# Patient Record
Sex: Female | Born: 1970
Health system: Southern US, Community
[De-identification: ages and names within clinical notes are randomized; demographics above are authoritative.]

## PROBLEM LIST (undated history)

## (undated) HISTORY — PX: BREAST BIOPSY: SHX20

---

## 2009-09-27 ENCOUNTER — Other Ambulatory Visit: Admission: RE | Admit: 2009-09-27 | Discharge: 2009-09-27 | Payer: Self-pay | Admitting: Family Medicine

## 2009-09-29 ENCOUNTER — Encounter: Admission: RE | Admit: 2009-09-29 | Discharge: 2009-09-29 | Payer: Self-pay | Admitting: Family Medicine

## 2009-10-04 ENCOUNTER — Encounter: Admission: RE | Admit: 2009-10-04 | Discharge: 2009-10-04 | Payer: Self-pay | Admitting: Family Medicine

## 2009-10-17 ENCOUNTER — Encounter: Admission: RE | Admit: 2009-10-17 | Discharge: 2009-10-17 | Payer: Self-pay | Admitting: Family Medicine

## 2010-07-14 ENCOUNTER — Encounter
Admission: RE | Admit: 2010-07-14 | Discharge: 2010-07-14 | Payer: Self-pay | Source: Home / Self Care | Attending: Family Medicine | Admitting: Family Medicine

## 2010-07-14 IMAGING — MG MM DIGITAL DIAGNOSTIC BILAT
8 of 10 series · 8 of 10 positions shown · non-contrast
Comparison: [DATE], [DATE] she

CLINICAL DATA: The patient underwent stereotactic biopsy in [DATE] for calcifications in the right 10 o'clock position.  Biopsy
showed atypia and surgical excision has been recommended although
the patient has not yet undergone surgical excision.  Today the
patient returns for annual mammogram, with referring physician
palpating an area of thickening 3 o'clock left breast, and with the
patient feeling cyclical monthly tenderness and fullness in both
axillary areas.

DIGITAL DIAGNOSTIC BILATERAL MAMMOGRAM WITH CAD AND BILATERAL
BREAST ULTRASOUND:

[R CC]
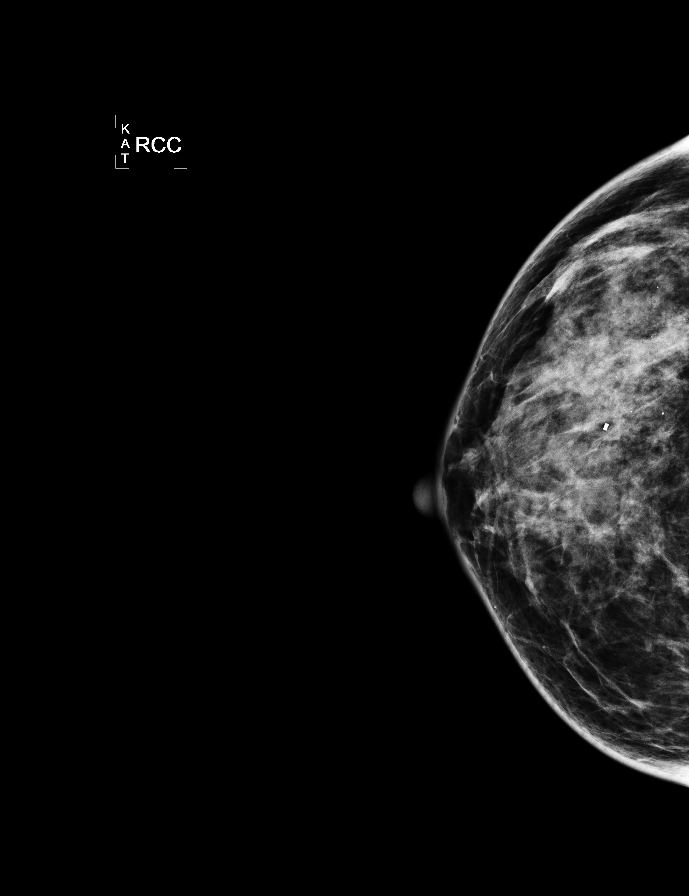

[L CC]
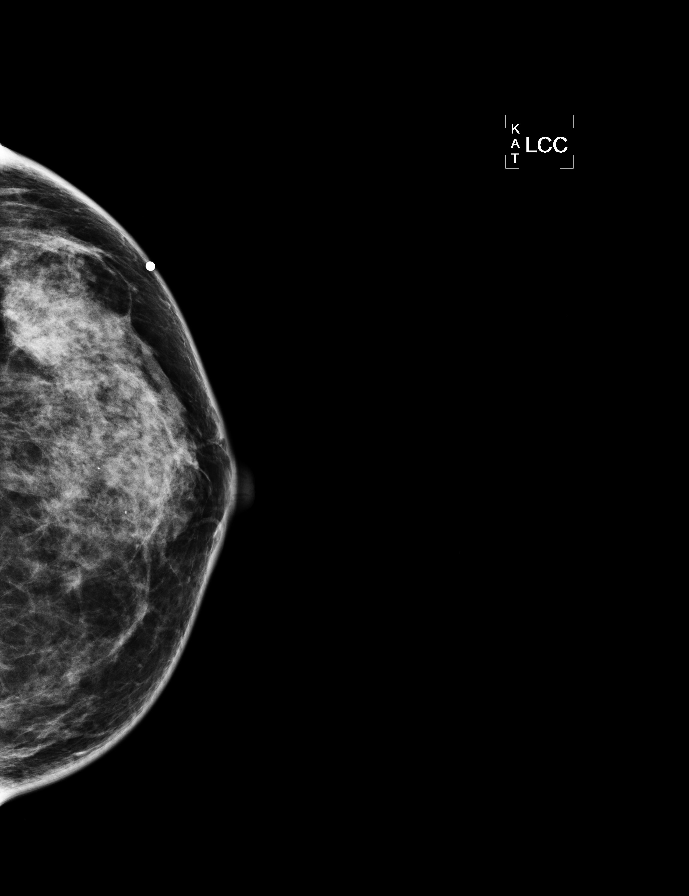

[L MLO]
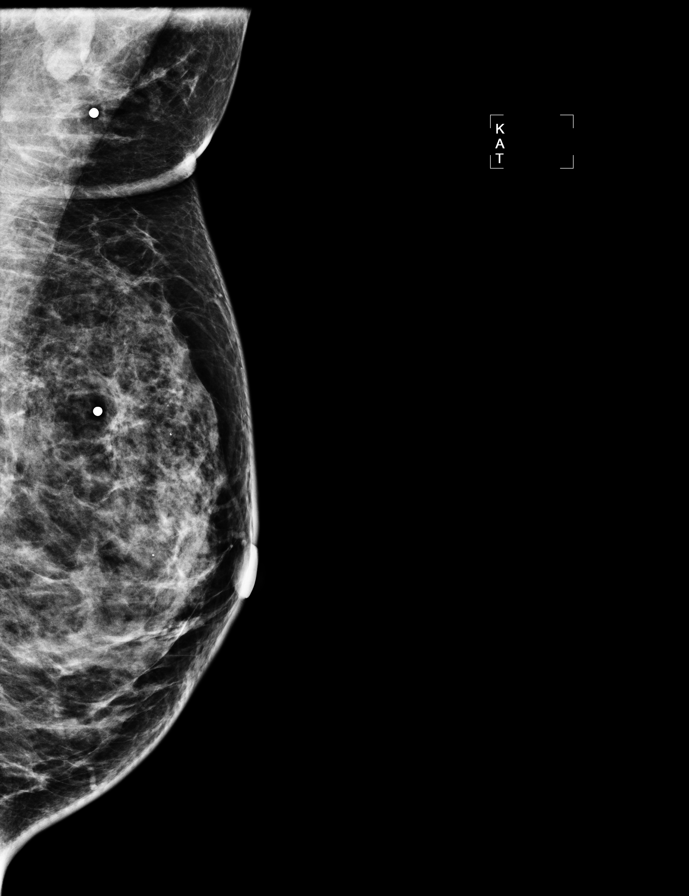

[R MLO (1 of 2)]
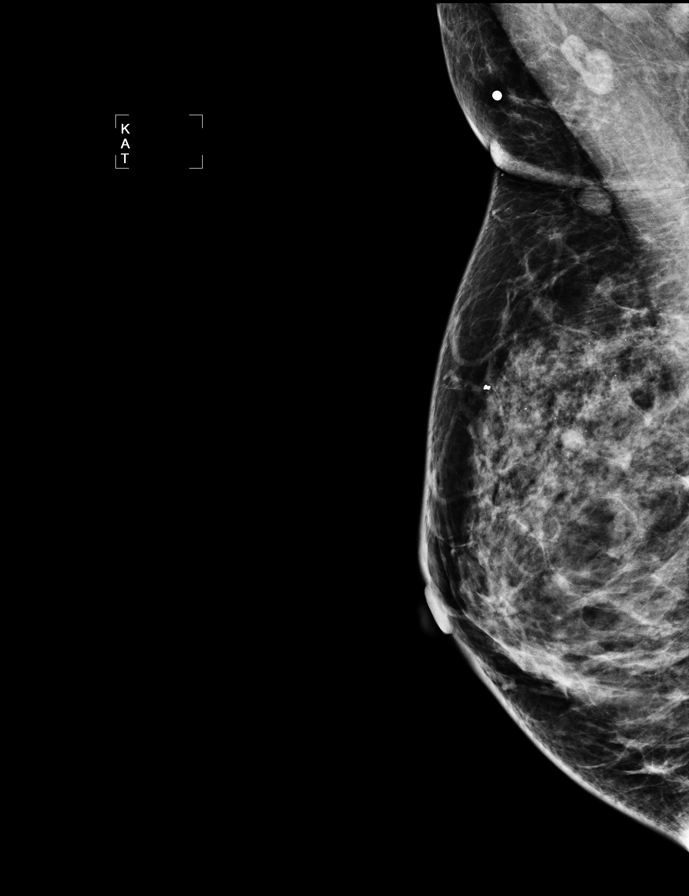

[L TAN (1 of 2)]
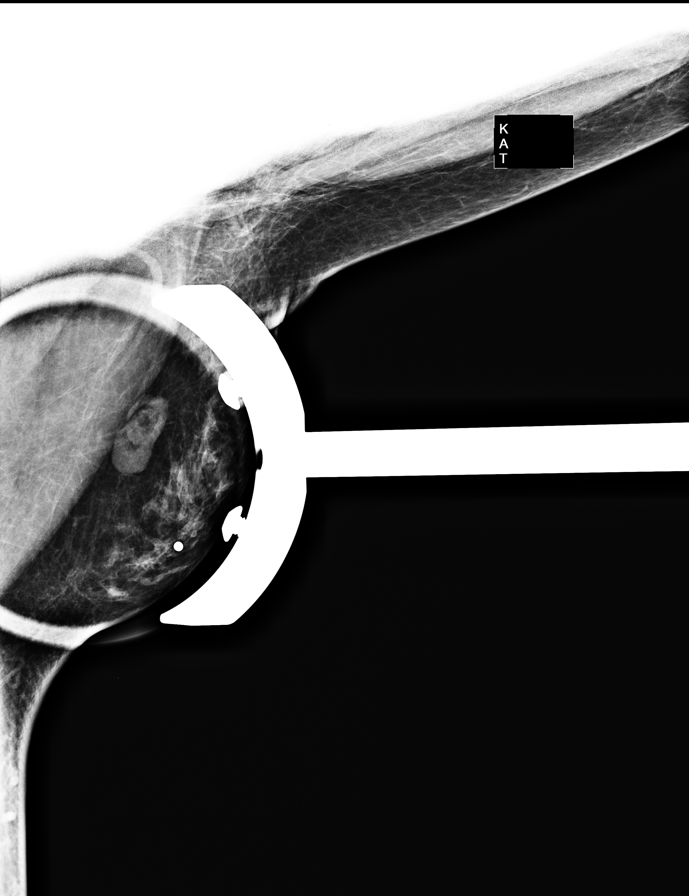

[R TAN]
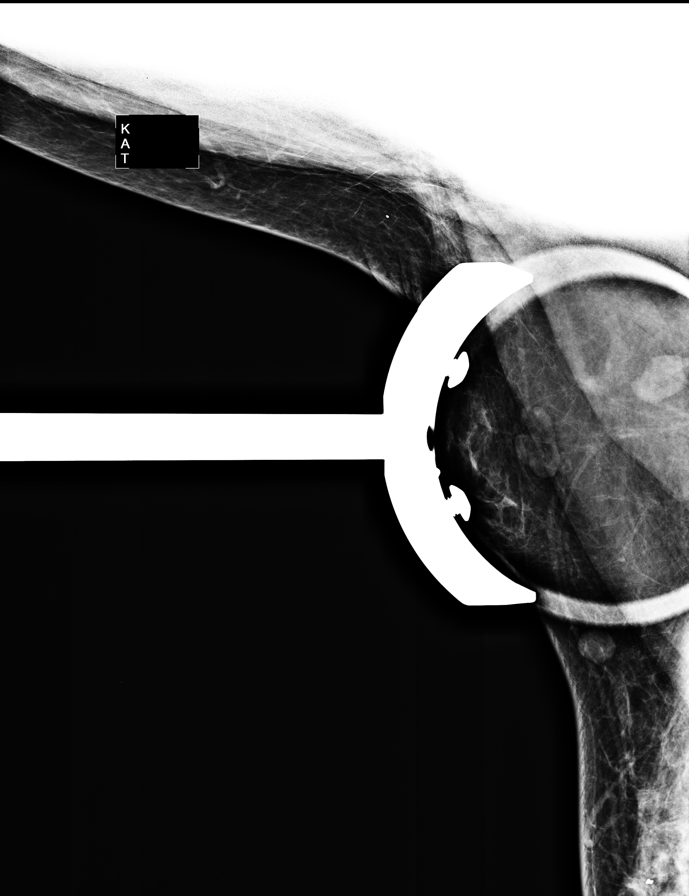

[L TAN (2 of 2)]
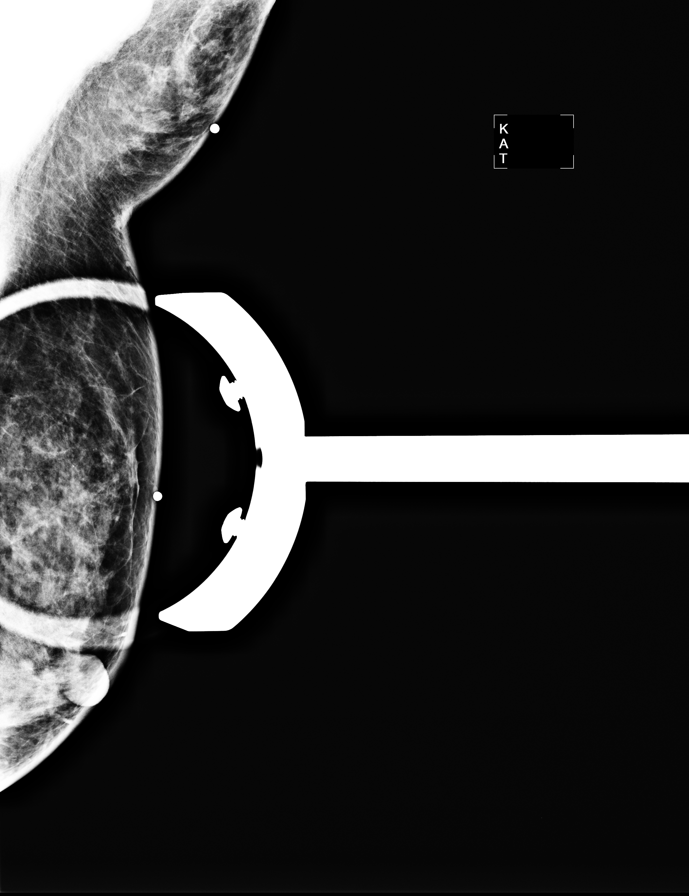

[R MLO (2 of 2)]
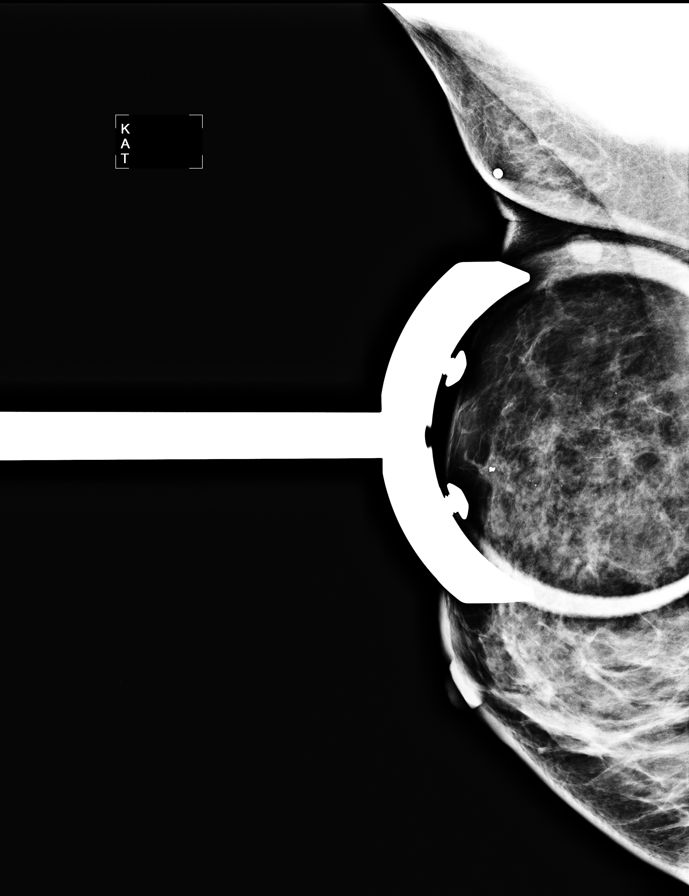

[8 of 10 positions shown; findings below may reference images not displayed]

FINDINGS: Breast tissue is heterogeneously dense.

On the right in the 12 o'clock position there is a stereotactic
marker clip replacing the previously biopsied calcifications.
Posterior and slightly inferior to this on the MLO view there is a
suggestion of a round approximately 5 mm asymmetry.  This does not
definitively persist on spot compression or true lateral views.
There are numerous sub centimeter right axillary lymph nodes.

On the left there are no suspicious mammographic abnormalities
including spot compression views of the area of palpable concern.
There are several normal-appearing left axillary lymph nodes.
Mammographic images were processed with CAD.

On physical exam, there is no suspicious palpable abnormality on
either side.

Ultrasound is performed, showing no abnormalities in the left
axilla or the left 3 o'clock position.  On the right, and in the
axillary tail the 10 o'clock position, there is an oval
circumscribed hypoechoic mass measuring 10 x 6 x 9 mm.  The central
portion has an area that is mildly hyperechoic and a blood vessel
is identified extending into the mass.  This may represent an
atypical appearing lymph node.  Ultrasound also demonstrates a
normal-appearing 1 cm axillary lymph node on the right.
IMPRESSION: No suspicious findings on the left.  On the right, in the 10
o'clock position approximately 10 cm from the nipple there is a new
suspicious mass that may represent an abnormal appearing lymph.

BI-RADS CATEGORY 4:  Suspicious abnormality - biopsy should be
considered.

Recommendation:

The patient will remain to undergo ultrasound guided core needle
biopsy today.  Regardless of the outcome of this procedure, the
patient has been strongly encouraged to undergo excisional biopsy
for the area of atypia discovered [DATE].  The patient has
agreed to have surgical consultation established on her behalf.  We
will await results of today's biopsy, and will inform the patient
of her established time for surgical consultation when we discuss
results with her on [REDACTED].

## 2012-12-04 ENCOUNTER — Other Ambulatory Visit: Payer: Self-pay | Admitting: Family Medicine

## 2012-12-04 DIAGNOSIS — R921 Mammographic calcification found on diagnostic imaging of breast: Secondary | ICD-10-CM

## 2012-12-22 ENCOUNTER — Other Ambulatory Visit: Payer: Self-pay

## 2012-12-30 ENCOUNTER — Ambulatory Visit
Admission: RE | Admit: 2012-12-30 | Discharge: 2012-12-30 | Disposition: A | Discharge status: Home / Self Care | Payer: Managed Care, Other (non HMO) | Source: Ambulatory Visit | Attending: Family Medicine | Admitting: Family Medicine

## 2012-12-30 ENCOUNTER — Other Ambulatory Visit: Payer: Self-pay | Admitting: Family Medicine

## 2012-12-30 DIAGNOSIS — R921 Mammographic calcification found on diagnostic imaging of breast: Secondary | ICD-10-CM

## 2013-05-27 ENCOUNTER — Other Ambulatory Visit: Payer: Self-pay | Admitting: Family Medicine

## 2013-05-27 DIAGNOSIS — R921 Mammographic calcification found on diagnostic imaging of breast: Secondary | ICD-10-CM

## 2013-07-06 ENCOUNTER — Ambulatory Visit
Admission: RE | Admit: 2013-07-06 | Discharge: 2013-07-06 | Disposition: A | Discharge status: Home / Self Care | Payer: Private Health Insurance - Indemnity | Source: Ambulatory Visit | Attending: Family Medicine | Admitting: Family Medicine

## 2013-07-06 DIAGNOSIS — R921 Mammographic calcification found on diagnostic imaging of breast: Secondary | ICD-10-CM

## 2013-08-10 ENCOUNTER — Other Ambulatory Visit: Payer: Self-pay | Admitting: Family Medicine

## 2013-08-10 ENCOUNTER — Other Ambulatory Visit (HOSPITAL_COMMUNITY)
Admission: RE | Admit: 2013-08-10 | Discharge: 2013-08-10 | Disposition: A | Discharge status: Home / Self Care | Payer: Private Health Insurance - Indemnity | Source: Ambulatory Visit | Attending: Family Medicine | Admitting: Family Medicine

## 2013-08-10 DIAGNOSIS — Z124 Encounter for screening for malignant neoplasm of cervix: Secondary | ICD-10-CM | POA: Insufficient documentation

## 2017-01-22 ENCOUNTER — Other Ambulatory Visit: Payer: Self-pay | Admitting: Family Medicine

## 2017-01-22 ENCOUNTER — Other Ambulatory Visit (HOSPITAL_COMMUNITY)
Admission: RE | Admit: 2017-01-22 | Discharge: 2017-01-22 | Disposition: A | Discharge status: Home / Self Care | Payer: Commercial Managed Care - PPO | Source: Ambulatory Visit | Attending: Family Medicine | Admitting: Family Medicine

## 2017-01-22 DIAGNOSIS — Z124 Encounter for screening for malignant neoplasm of cervix: Secondary | ICD-10-CM | POA: Diagnosis not present

## 2017-01-24 LAB — CYTOLOGY - PAP: DIAGNOSIS: NEGATIVE

## 2017-02-26 ENCOUNTER — Ambulatory Visit (INDEPENDENT_AMBULATORY_CARE_PROVIDER_SITE_OTHER): Payer: Commercial Managed Care - PPO | Admitting: Sports Medicine

## 2017-02-26 ENCOUNTER — Encounter: Payer: Self-pay | Admitting: Sports Medicine

## 2017-02-26 ENCOUNTER — Ambulatory Visit (INDEPENDENT_AMBULATORY_CARE_PROVIDER_SITE_OTHER): Payer: Commercial Managed Care - PPO

## 2017-02-26 VITALS — Ht 66.0 in | Wt 135.0 lb

## 2017-02-26 DIAGNOSIS — M722 Plantar fascial fibromatosis: Secondary | ICD-10-CM

## 2017-02-26 DIAGNOSIS — M79672 Pain in left foot: Secondary | ICD-10-CM

## 2017-02-26 NOTE — Progress Notes (Signed)
   Subjective:    Patient ID: Amy Roy, female    DOB: 04-18-1971, 46 y.o.   MRN: [id number]  HPI  My left heel feels swollen and pain about 3 months ago.  Started hurting worse when [place] dog [place] the beach.      Review of Systems  All other systems reviewed and are negative.      Objective:   Physical Exam        Assessment & Plan:

## 2017-02-26 NOTE — Progress Notes (Signed)
Subjective: Rudi HeapHanxi Ferrone is a 46 y.o. female patient presents to office with complaint of heel pain on the left. Patient admits to post static dyskinesia for 3 months. Patient has treated this problem with stretching with no relief. Denies any other pedal complaints.   There are no active problems to display for this patient.   No current outpatient prescriptions on file prior to visit.   No current facility-administered medications on file prior to visit.     No Known Allergies  Objective: Physical Exam General: The patient is alert and oriented x3 in no acute distress.  Dermatology: Skin is warm, dry and supple bilateral lower extremities. Nails 1-10 are normal. There is no erythema, edema, no eccymosis, no open lesions present. Integument is otherwise unremarkable.  Vascular: Dorsalis Pedis pulse and Posterior Tibial pulse are 2/4 bilateral. Capillary fill time is immediate to all digits.  Neurological: Grossly intact to light touch with an achilles reflex of +2/5 and a  negative Tinel's sign bilateral.  Musculoskeletal: Tenderness to palpation at the medial calcaneal tubercale and through the insertion of the plantar fascia on the left foot. No pain with compression of calcaneus bilateral. No pain with tuning fork to calcaneus bilateral. No pain with calf compression bilateral. There is decreased Ankle joint range of motion bilateral. All other joints range of motion within normal limits bilateral. Strength 5/5 in all groups bilateral.   Gait: Unassisted, Antalgic avoid weight on left/right heel  Xray, Left foot:  Normal osseous mineralization. Joint spaces preserved. No fracture/dislocation/boney destruction. Calcaneal spur present with mild thickening of plantar fascia. No other soft tissue abnormalities or radiopaque foreign bodies.   Assessment and Plan: Problem List Items Addressed This Visit    None    Visit Diagnoses    Plantar fasciitis    -  Primary   Relevant Orders    DG Foot Complete Left (Completed)   Pain of left heel          -Complete examination performed.  -Xrays reviewed -Discussed with patient in detail the condition of plantar fasciitis, how this occurs and general treatment options. Explained both conservative and surgical treatments.  -Patient declined injection and oral meds this visit  -Recommend at minimum OTC motrin one tab BID x 14 days -Recommended good supportive shoes and advised use of OTC insert. Explained to patient that if these orthoses work well, we will continue with these. If these do not improve her condition and  pain, we will consider custom molded orthoses. -Explained in detail the use of the fascial brace for the left which was dispensed at today's visit. -Explained and dispensed to patient daily stretching exercises. -Recommend patient to ice affected area 1-2x daily. -Patient to return to office in 4 weeks for follow up or sooner if problems or questions arise. Advised patient if no better to consider injection next visit.   Asencion Islamitorya Chrishun Scheer, DPM

## 2017-02-26 NOTE — Patient Instructions (Signed)

## 2017-03-26 ENCOUNTER — Ambulatory Visit (INDEPENDENT_AMBULATORY_CARE_PROVIDER_SITE_OTHER): Payer: Commercial Managed Care - PPO | Admitting: Sports Medicine

## 2017-03-26 ENCOUNTER — Encounter: Payer: Self-pay | Admitting: Sports Medicine

## 2017-03-26 DIAGNOSIS — M722 Plantar fascial fibromatosis: Secondary | ICD-10-CM

## 2017-03-26 DIAGNOSIS — M79672 Pain in left foot: Secondary | ICD-10-CM

## 2017-03-26 NOTE — Progress Notes (Signed)
Subjective: Kellyn Kutsch is a 46 y.o. female patient returns for follow up on left heel pain. Reports her new shoes and stretching and icing has helped. Does not have any pain. Denies any other pedal complaints.   There are no active problems to display for this patient.   No current outpatient prescriptions on file prior to visit.   No current facility-administered medications on file prior to visit.     No Known Allergies  Objective: Physical Exam General: The patient is alert and oriented x3 in no acute distress.  Dermatology: Skin is warm, dry and supple bilateral lower extremities. Nails 1-10 are normal. There is no erythema, edema, no eccymosis, no open lesions present. Integument is otherwise unremarkable.  Vascular: Dorsalis Pedis pulse and Posterior Tibial pulse are 2/4 bilateral. Capillary fill time is immediate to all digits.  Neurological: Grossly intact to light touch with an achilles reflex of +2/5 and a  negative Tinel's sign bilateral.  Musculoskeletal: There is no tenderness to palpation at the medial calcaneal tubercale and through the insertion of the plantar fascia on the left foot. No pain with compression of calcaneus bilateral. No pain with tuning fork to calcaneus bilateral. No pain with calf compression bilateral. There is decreased Ankle joint range of motion bilateral. All other joints range of motion within normal limits bilateral. Strength 5/5 in all groups bilateral.   Assessment and Plan: Problem List Items Addressed This Visit    None    Visit Diagnoses    Plantar fasciitis    -  Primary   Pain of left heel         -Complete examination performed.  -Re-Discussed with patient in detail the condition of plantar fasciitis, how this occurs and long term care. -Continue with fascial brace x 1 month  -Continue with daily stretching exercises to prevent recurrence  -Recommend patient to ice affected area 1-2x daily after activity to prevent  flare. -Patient to return to office as needed for follow up or sooner if problems or questions arise.  Asencion Islam, DPM

## 2020-11-30 ENCOUNTER — Other Ambulatory Visit: Payer: Self-pay | Admitting: Family Medicine

## 2020-11-30 DIAGNOSIS — Z1231 Encounter for screening mammogram for malignant neoplasm of breast: Secondary | ICD-10-CM

## 2021-01-16 ENCOUNTER — Ambulatory Visit
Admission: RE | Admit: 2021-01-16 | Discharge: 2021-01-16 | Disposition: A | Discharge status: Home / Self Care | Payer: Commercial Managed Care - PPO | Source: Ambulatory Visit | Attending: Family Medicine | Admitting: Family Medicine

## 2021-01-16 ENCOUNTER — Other Ambulatory Visit: Payer: Self-pay

## 2021-01-16 DIAGNOSIS — Z1231 Encounter for screening mammogram for malignant neoplasm of breast: Secondary | ICD-10-CM

## 2021-01-19 ENCOUNTER — Other Ambulatory Visit: Payer: Self-pay | Admitting: Family Medicine

## 2021-01-19 DIAGNOSIS — R928 Other abnormal and inconclusive findings on diagnostic imaging of breast: Secondary | ICD-10-CM

## 2021-01-24 ENCOUNTER — Other Ambulatory Visit: Payer: Self-pay | Admitting: Family Medicine

## 2021-01-24 ENCOUNTER — Other Ambulatory Visit: Payer: Self-pay

## 2021-01-24 ENCOUNTER — Ambulatory Visit
Admission: RE | Admit: 2021-01-24 | Discharge: 2021-01-24 | Disposition: A | Discharge status: Home / Self Care | Payer: Commercial Managed Care - PPO | Source: Ambulatory Visit | Attending: Family Medicine | Admitting: Family Medicine

## 2021-01-24 DIAGNOSIS — R928 Other abnormal and inconclusive findings on diagnostic imaging of breast: Secondary | ICD-10-CM

## 2021-01-24 DIAGNOSIS — R921 Mammographic calcification found on diagnostic imaging of breast: Secondary | ICD-10-CM

## 2021-02-02 ENCOUNTER — Ambulatory Visit
Admission: RE | Admit: 2021-02-02 | Discharge: 2021-02-02 | Disposition: A | Discharge status: Home / Self Care | Payer: Commercial Managed Care - PPO | Source: Ambulatory Visit | Attending: Family Medicine | Admitting: Family Medicine

## 2021-02-02 ENCOUNTER — Other Ambulatory Visit: Payer: Self-pay

## 2021-02-02 ENCOUNTER — Other Ambulatory Visit: Payer: Self-pay | Admitting: Family Medicine

## 2021-02-02 DIAGNOSIS — R921 Mammographic calcification found on diagnostic imaging of breast: Secondary | ICD-10-CM

## 2022-01-29 ENCOUNTER — Other Ambulatory Visit: Payer: Self-pay | Admitting: Family Medicine

## 2022-01-29 DIAGNOSIS — Z1231 Encounter for screening mammogram for malignant neoplasm of breast: Secondary | ICD-10-CM

## 2022-02-13 ENCOUNTER — Ambulatory Visit
Admission: RE | Admit: 2022-02-13 | Discharge: 2022-02-13 | Disposition: A | Discharge status: Home / Self Care | Payer: Commercial Managed Care - PPO | Source: Ambulatory Visit | Attending: Family Medicine | Admitting: Family Medicine

## 2022-02-13 DIAGNOSIS — Z1231 Encounter for screening mammogram for malignant neoplasm of breast: Secondary | ICD-10-CM

## 2023-01-11 ENCOUNTER — Other Ambulatory Visit: Payer: Self-pay | Admitting: Family Medicine

## 2023-01-11 DIAGNOSIS — Z0000 Encounter for general adult medical examination without abnormal findings: Secondary | ICD-10-CM

## 2023-02-15 ENCOUNTER — Ambulatory Visit
Admission: RE | Admit: 2023-02-15 | Discharge: 2023-02-15 | Disposition: A | Discharge status: Home / Self Care | Payer: Commercial Managed Care - PPO | Source: Ambulatory Visit | Attending: Family Medicine | Admitting: Family Medicine

## 2023-02-15 DIAGNOSIS — Z0000 Encounter for general adult medical examination without abnormal findings: Secondary | ICD-10-CM

## 2024-05-13 ENCOUNTER — Other Ambulatory Visit: Payer: Self-pay | Admitting: Physician Assistant

## 2024-05-13 DIAGNOSIS — Z1231 Encounter for screening mammogram for malignant neoplasm of breast: Secondary | ICD-10-CM

## 2024-05-22 ENCOUNTER — Ambulatory Visit
Admission: RE | Admit: 2024-05-22 | Discharge: 2024-05-22 | Disposition: A | Discharge status: Home / Self Care | Source: Ambulatory Visit | Attending: Physician Assistant | Admitting: Physician Assistant

## 2024-05-22 DIAGNOSIS — Z1231 Encounter for screening mammogram for malignant neoplasm of breast: Secondary | ICD-10-CM
